# Patient Record
Sex: Female | Born: 1980 | Race: White | Hispanic: No | Smoking: Former smoker
Health system: Southern US, Community
[De-identification: ages and names within clinical notes are randomized; demographics above are authoritative.]

## PROBLEM LIST (undated history)

## (undated) DIAGNOSIS — Z9109 Other allergy status, other than to drugs and biological substances: Secondary | ICD-10-CM

## (undated) HISTORY — PX: BACK SURGERY: SHX140

---

## 2007-01-19 ENCOUNTER — Emergency Department (HOSPITAL_COMMUNITY): Admission: EM | Admit: 2007-01-19 | Discharge: 2007-01-19 | Payer: Self-pay | Admitting: *Deleted

## 2008-07-06 IMAGING — CR DG ANKLE COMPLETE 3+V*R*
3 series · 3 of 3 positions shown · non-contrast
Comparison: none

CLINICAL DATA: Twisting injury.  Ankle pain.
 RIGHT ANKLE ? 3 VIEW:

[t ankle joint ap right]
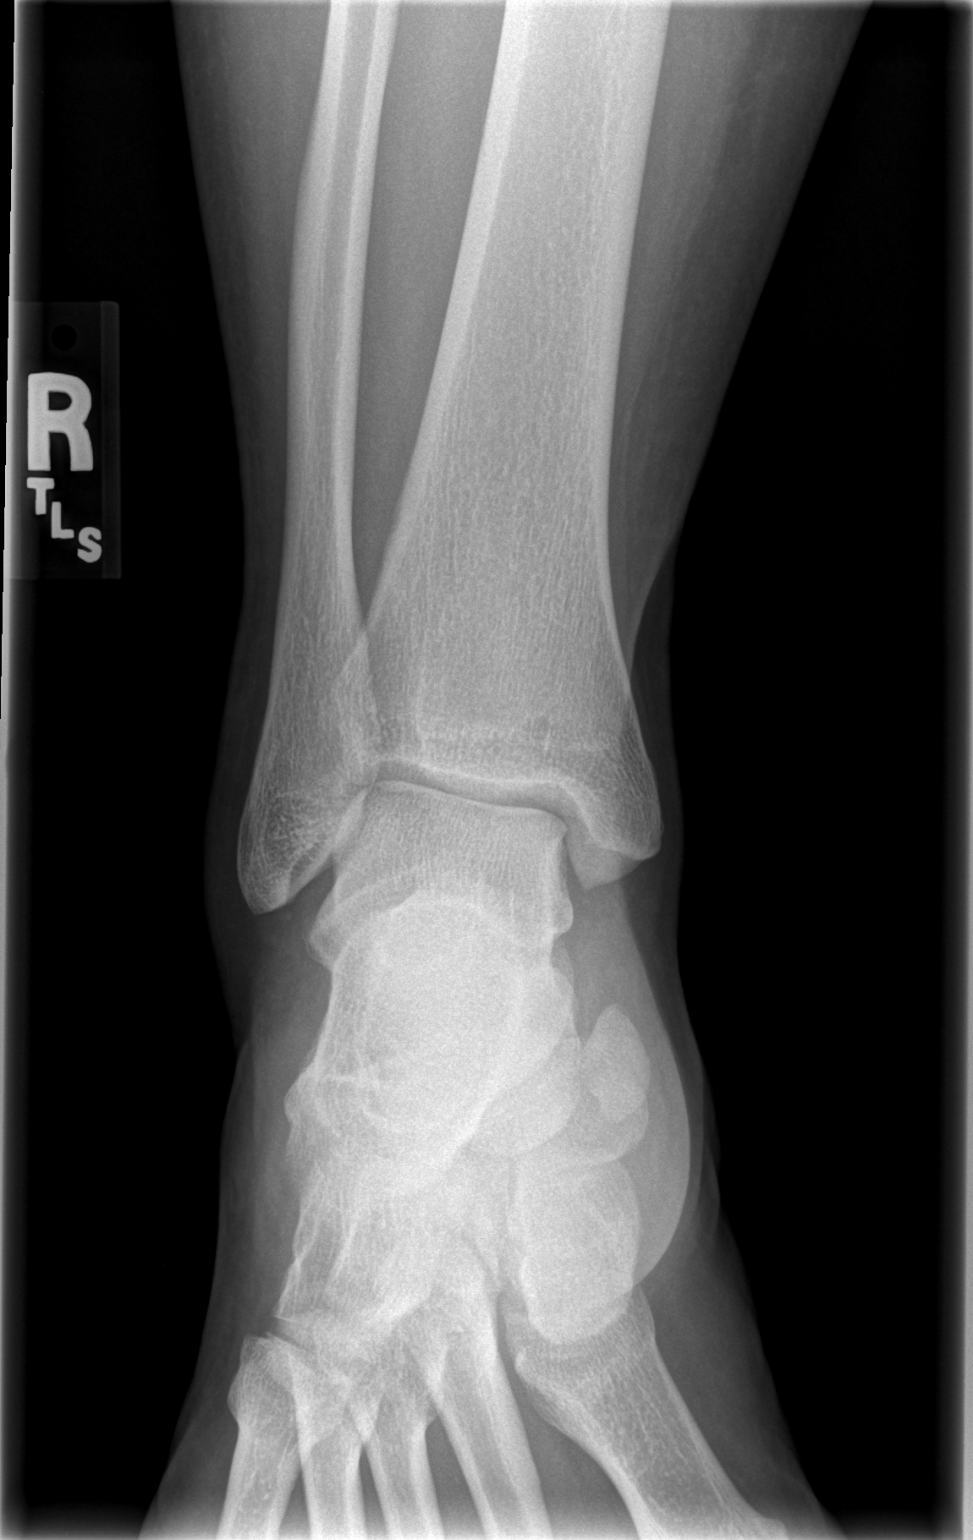

[t ankle joint oblique right]
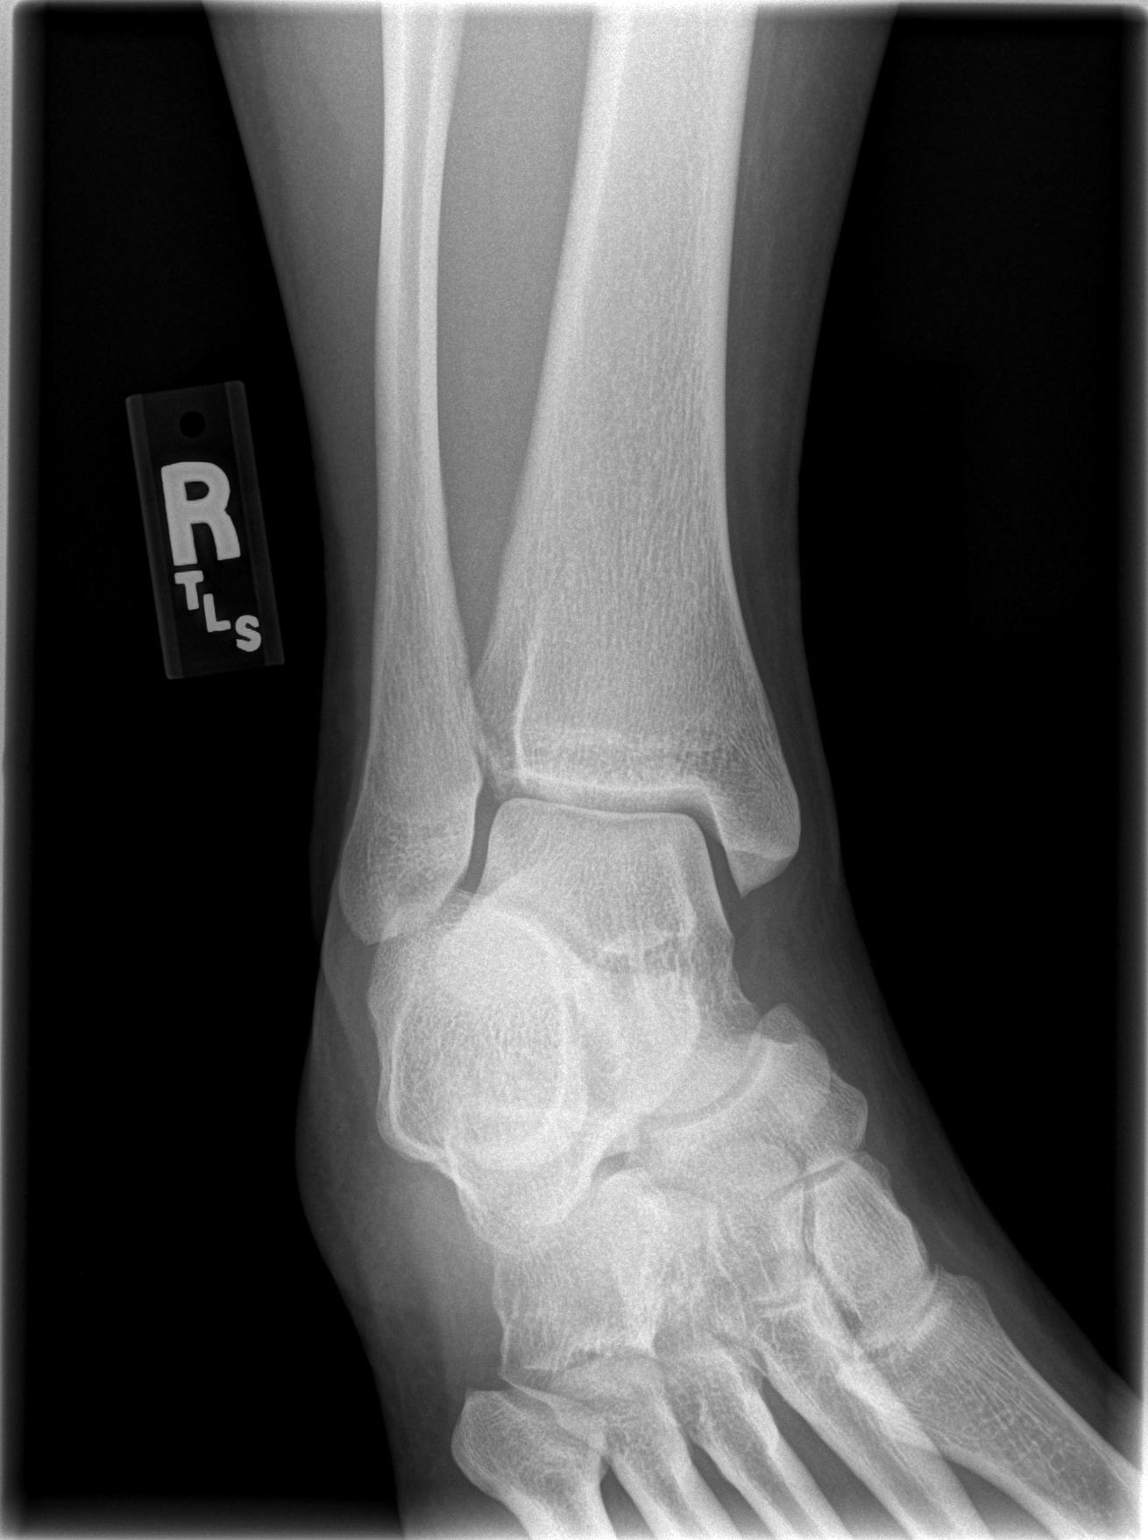

[t ankle joint lat right]
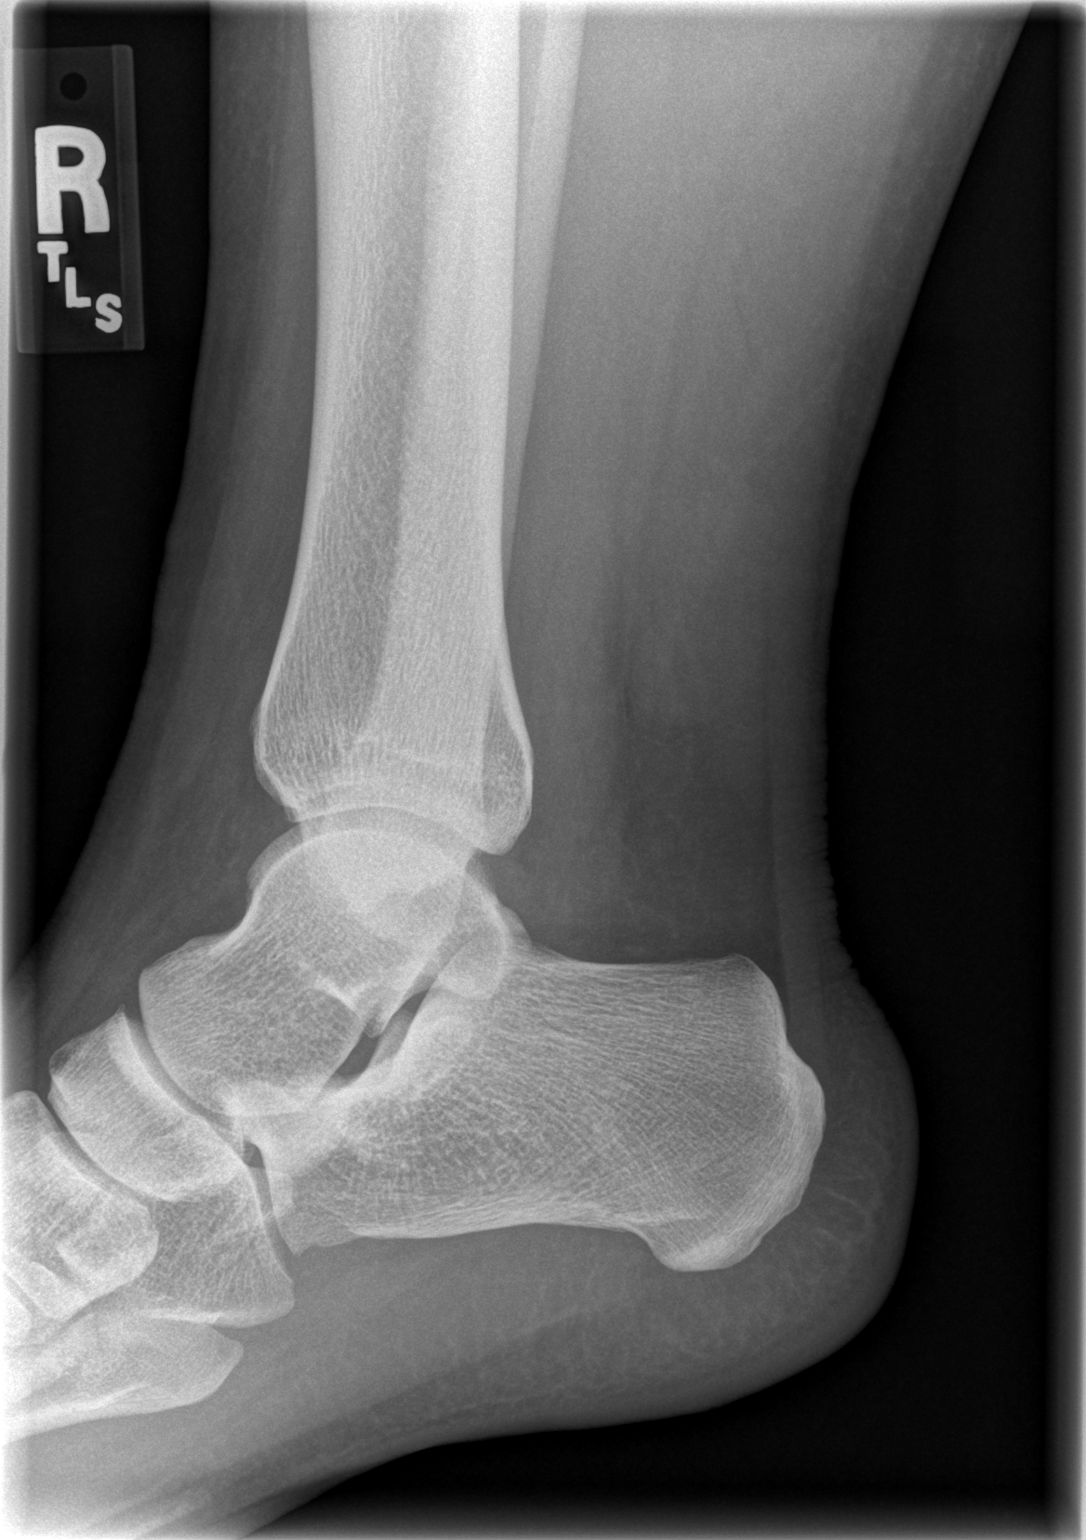

[3 of 3 positions shown; findings below may reference images not displayed]

FINDINGS: Soft tissues normal. There is no joint effusion.  Negative for fracture.
 RIGHT FOOT ? 3 VIEW:
FINDINGS: Alignment of the foot anatomic. Negative for fracture.  Hallux valgus deformity with secondary degenerative changes.  Incidental prominent accessory navicular.
IMPRESSION: Negative for fracture.

## 2010-07-24 ENCOUNTER — Inpatient Hospital Stay (HOSPITAL_COMMUNITY)
Admission: AD | Admit: 2010-07-24 | Discharge: 2010-07-24 | Payer: Self-pay | Source: Home / Self Care | Attending: Obstetrics and Gynecology | Admitting: Obstetrics and Gynecology

## 2010-08-16 ENCOUNTER — Inpatient Hospital Stay (HOSPITAL_COMMUNITY)
Admission: AD | Admit: 2010-08-16 | Discharge: 2010-08-19 | Payer: Self-pay | Source: Home / Self Care | Attending: Obstetrics and Gynecology | Admitting: Obstetrics and Gynecology

## 2010-08-16 LAB — CBC
HCT: 35.7 % — ABNORMAL LOW (ref 36.0–46.0)
Hemoglobin: 12.2 g/dL (ref 12.0–15.0)
MCH: 30.3 pg (ref 26.0–34.0)
MCHC: 34.2 g/dL (ref 30.0–36.0)
MCV: 88.8 fL (ref 78.0–100.0)
Platelets: 176 10*3/uL (ref 150–400)
RBC: 4.02 MIL/uL (ref 3.87–5.11)
RDW: 13.3 % (ref 11.5–15.5)
WBC: 9.5 10*3/uL (ref 4.0–10.5)

## 2010-08-17 LAB — RPR: RPR Ser Ql: NONREACTIVE

## 2010-08-18 LAB — CBC
HCT: 32.9 % — ABNORMAL LOW (ref 36.0–46.0)
Hemoglobin: 11.2 g/dL — ABNORMAL LOW (ref 12.0–15.0)
MCH: 30.4 pg (ref 26.0–34.0)
MCHC: 34 g/dL (ref 30.0–36.0)
MCV: 89.4 fL (ref 78.0–100.0)
Platelets: 155 10*3/uL (ref 150–400)
RBC: 3.68 MIL/uL — ABNORMAL LOW (ref 3.87–5.11)
RDW: 13.7 % (ref 11.5–15.5)
WBC: 12.7 10*3/uL — ABNORMAL HIGH (ref 4.0–10.5)

## 2010-08-24 ENCOUNTER — Ambulatory Visit
Admission: RE | Admit: 2010-08-24 | Discharge: 2010-08-24 | Payer: Self-pay | Source: Home / Self Care | Admitting: Obstetrics and Gynecology

## 2010-08-31 ENCOUNTER — Ambulatory Visit
Admission: RE | Admit: 2010-08-31 | Discharge: 2010-08-31 | Payer: Self-pay | Source: Home / Self Care | Admitting: Obstetrics and Gynecology

## 2011-07-27 ENCOUNTER — Encounter: Payer: Self-pay | Admitting: Emergency Medicine

## 2011-07-27 ENCOUNTER — Emergency Department (INDEPENDENT_AMBULATORY_CARE_PROVIDER_SITE_OTHER)
Admission: EM | Admit: 2011-07-27 | Discharge: 2011-07-27 | Disposition: A | Discharge status: Home / Self Care | Payer: 59 | Source: Home / Self Care | Attending: Emergency Medicine | Admitting: Emergency Medicine

## 2011-07-27 DIAGNOSIS — T7840XA Allergy, unspecified, initial encounter: Secondary | ICD-10-CM

## 2011-07-27 DIAGNOSIS — H1011 Acute atopic conjunctivitis, right eye: Secondary | ICD-10-CM

## 2011-07-27 DIAGNOSIS — H1045 Other chronic allergic conjunctivitis: Secondary | ICD-10-CM

## 2011-07-27 HISTORY — DX: Other allergy status, other than to drugs and biological substances: Z91.09

## 2011-07-27 MED ORDER — PREDNISONE 50 MG PO TABS: ORAL_TABLET | ORAL | Status: AC

## 2011-07-27 MED ORDER — POLYETHYL GLYCOL-PROPYL GLYCOL 0.4-0.3 % OP SOLN: 1.0000 [drp] | Freq: Four times a day (QID) | OPHTHALMIC | Status: DC | PRN

## 2011-07-27 MED ORDER — LORATADINE 10 MG PO TABS: 10.0000 mg | ORAL_TABLET | Freq: Every day | ORAL | Status: AC

## 2011-07-27 MED ORDER — KETOTIFEN FUMARATE 0.025 % OP SOLN: 1.0000 [drp] | Freq: Two times a day (BID) | OPHTHALMIC | Status: AC

## 2011-07-27 NOTE — ED Notes (Signed)
Pt eye swelled up yesterday at work mid-day. Benedryl helped swelling go down. Was fine all evening. No discharge this morning. Today it is itchy, red, and warm, and swelling started again.

## 2011-07-27 NOTE — ED Provider Notes (Signed)
History     CSN: [id number] Arrival date & time: 07/27/2011 12:17 PM   First MD Initiated Contact with Patient 07/27/11 1124      Chief Complaint  Patient presents with  . Conjunctivitis    (Consider location/radiation/quality/duration/timing/severity/associated sxs/prior treatment) HPI Comments: Pt with right periorbital swelling, itching starting yesterday while at work. Took ben adryl and applied ice with relief in itching, swellign. Also with mild right eye conjunctvial injection, "gritty" and "dry" sensation. Wears contacts which she threw away. Pt is highly allergic to most soaps, some lotions, thinks may have accidentally rubbed some across her face. No N/V, fevers, HA, photophobia, eye pain. Pt currently recovering from URI.   Patient is a 30 y.o. female presenting with conjunctivitis. The history is provided by the patient.  Conjunctivitis  The current episode started yesterday. The symptoms are aggravated by nothing. Associated symptoms include eye itching, congestion, rhinorrhea and eye redness. Pertinent negatives include no fever, no decreased vision, no double vision, no photophobia, no ear discharge, no ear pain, no headaches, no hearing loss, no mouth sores, no sore throat, no stridor, no swollen glands and no eye discharge.    Past Medical History  Diagnosis Date  . Environmental allergies     History reviewed. No pertinent past surgical history.  History reviewed. No pertinent family history.  History  Substance Use Topics  . Smoking status: Never Smoker   . Smokeless tobacco: Not on file  . Alcohol Use: Yes    OB History    Grav Para Term Preterm Abortions TAB SAB Ect Mult Living                  Review of Systems  Constitutional: Negative for fever.  HENT: Positive for congestion and rhinorrhea. Negative for hearing loss, ear pain, sore throat, mouth sores and ear discharge.   Eyes: Positive for redness and itching. Negative for double vision,  photophobia and discharge.  Respiratory: Negative for stridor.   Neurological: Negative for headaches.    Allergies  Sodium lauryl sulfate  Home Medications   Current Outpatient Rx  Name Route Sig Dispense Refill  . KETOTIFEN FUMARATE 0.025 % OP SOLN Right Eye Place 1 drop into the right eye 2 (two) times daily. 5 mL 0  . LORATADINE 10 MG PO TABS Oral Take 1 tablet (10 mg total) by mouth daily. 10 tablet 0  . POLYETHYL GLYCOL-PROPYL GLYCOL 0.4-0.3 % OP SOLN Ophthalmic Apply 1 drop to eye 4 (four) times daily as needed. 5 mL 0  . PREDNISONE 50 MG PO TABS  1 tablet po daily x 2 days, then 1/2 tablet once daily for 2 days 5 tablet 0    BP 109/71  Pulse 94  Temp(Src) 97.5 F (36.4 C) (Oral)  Resp 20  SpO2 98%  LMP 07/23/2011  Physical Exam  Nursing note and vitals reviewed. Constitutional: She is oriented to person, place, and time. She appears well-developed and well-nourished. No distress.  HENT:  Head: Normocephalic and atraumatic.  Eyes: EOM and lids are normal. Pupils are equal, round, and reactive to light. No foreign bodies found. Right eye exhibits no chemosis, no discharge, no exudate and no hordeolum. No foreign body present in the right eye. Right conjunctiva is injected. Right conjunctiva has no hemorrhage. No scleral icterus.       Upper eyelid swelling. Nontender. No rash.   Neck: Normal range of motion.  Cardiovascular: Regular rhythm.   Pulmonary/Chest: Effort normal and breath sounds normal.  Abdominal:  She exhibits no distension.  Musculoskeletal: Normal range of motion.  Neurological: She is alert and oriented to person, place, and time.  Skin: Skin is warm and dry.  Psychiatric: She has a normal mood and affect. Her behavior is normal. Judgment and thought content normal.    ED Course  Procedures (including critical care time)  Labs Reviewed - No data to display No results found.   1. Allergic reaction   2. Allergic conjunctivitis of right eye        MDM    Luiz Blare, MD 07/27/11 1339

## 2011-10-29 ENCOUNTER — Encounter: Payer: Self-pay | Admitting: *Deleted

## 2011-10-29 ENCOUNTER — Emergency Department
Admission: EM | Admit: 2011-10-29 | Discharge: 2011-10-29 | Disposition: A | Discharge status: Home / Self Care | Payer: 59 | Source: Home / Self Care | Attending: Family Medicine | Admitting: Family Medicine

## 2011-10-29 ENCOUNTER — Emergency Department: Admit: 2011-10-29 | Discharge: 2011-10-29 | Disposition: A | Discharge status: Home / Self Care | Payer: 59

## 2011-10-29 DIAGNOSIS — S335XXA Sprain of ligaments of lumbar spine, initial encounter: Secondary | ICD-10-CM

## 2011-10-29 MED ORDER — CYCLOBENZAPRINE HCL 10 MG PO TABS: 10.0000 mg | ORAL_TABLET | Freq: Two times a day (BID) | ORAL | Status: AC | PRN

## 2011-10-29 MED ORDER — NAPROXEN 500 MG PO TABS: 500.0000 mg | ORAL_TABLET | Freq: Two times a day (BID) | ORAL | Status: AC

## 2011-10-29 MED ORDER — HYDROCODONE-ACETAMINOPHEN 5-500 MG PO TABS: 1.0000 | ORAL_TABLET | Freq: Every evening | ORAL | Status: AC | PRN

## 2011-10-29 NOTE — ED Notes (Signed)
Pt c/o low back pain x this AM. She has taken IBF with no relief.

## 2011-10-29 NOTE — Discharge Instructions (Signed)
Apply ice pack for 30 to 45 minutes every 1 to 4 hours.  Continue until pain decreases. Limit athletic activities until improved.  Begin exercises as per instruction sheets.

## 2011-10-29 NOTE — ED Provider Notes (Signed)
History     CSN: [id number]  Arrival date & time 10/29/11  7829   First MD Initiated Contact with Patient 10/29/11 1933      Chief Complaint  Patient presents with  . Back Pain      HPI Comments: Patient states that after an uneventful 3 mile run yesterday she did some back stretching exercises that she does not normally do.  This morning she awoke with stiffness and nonradiating pain in her lower back.  The pain is worse when standing, and she feels better leaning forward.  No bowel or bladder dysfunction.  No saddle numbness.  Patient is a 31 y.o. female presenting with back pain. The history is provided by the patient.  Back Pain  Episode onset: this morning. The problem occurs constantly. The problem has been gradually worsening. The pain is associated with no known injury. Quality: dull. The pain does not radiate. The pain is at a severity of 4/10. The pain is moderate. The symptoms are aggravated by bending, twisting and certain positions. The pain is worse during the day. Stiffness is present all day. Pertinent negatives include no chest pain, no fever, no numbness, no weight loss, no headaches, no abdominal pain, no abdominal swelling, no bowel incontinence, no perianal numbness, no bladder incontinence, no dysuria, no pelvic pain, no leg pain, no paresthesias, no paresis, no tingling and no weakness. She has tried NSAIDs for the symptoms. The treatment provided no relief.    Past Medical History  Diagnosis Date  . Environmental allergies     Past Surgical History  Procedure Date  . Back surgery     mass removed from upper back     Family History  Problem Relation Age of Onset  . Hyperlipidemia Mother   . Diabetes Father   . Thrombosis Father     History  Substance Use Topics  . Smoking status: Former Games developer  . Smokeless tobacco: Not on file  . Alcohol Use: No    OB History    Grav Para Term Preterm Abortions TAB SAB Ect Mult Living                  Review  of Systems  Constitutional: Negative for fever and weight loss.  Cardiovascular: Negative for chest pain.  Gastrointestinal: Negative for abdominal pain and bowel incontinence.  Genitourinary: Negative for bladder incontinence, dysuria and pelvic pain.  Musculoskeletal: Positive for back pain.  Neurological: Negative for tingling, weakness, numbness, headaches and paresthesias.  All other systems reviewed and are negative.    Allergies  Parabens and Sodium lauryl sulfate  Home Medications   Current Outpatient Rx  Name Route Sig Dispense Refill  . CYCLOBENZAPRINE HCL 10 MG PO TABS Oral Take 1 tablet (10 mg total) by mouth 2 (two) times daily as needed for muscle spasms. 20 tablet 0  . HYDROCODONE-ACETAMINOPHEN 5-500 MG PO TABS Oral Take 1 tablet by mouth at bedtime as needed for pain. 10 tablet 0  . LORATADINE 10 MG PO TABS Oral Take 1 tablet (10 mg total) by mouth daily. 10 tablet 0  . NAPROXEN 500 MG PO TABS Oral Take 1 tablet (500 mg total) by mouth 2 (two) times daily. (every 12 hours with food) 20 tablet 1  . POLYETHYL GLYCOL-PROPYL GLYCOL 0.4-0.3 % OP SOLN Ophthalmic Apply 1 drop to eye 4 (four) times daily as needed. 5 mL 0    BP 117/76  Pulse 60  Temp(Src) 97.8 F (36.6 C) (Oral)  Resp 16  Ht 5\' 6"  (1.676 m)  Wt 191 lb (86.637 kg)  BMI 30.83 kg/m2  SpO2 100%  LMP 10/15/2011  Physical Exam  Nursing note and vitals reviewed. Constitutional: She is oriented to person, place, and time. She appears well-developed and well-nourished. No distress.  HENT:  Head: Normocephalic.  Eyes: Conjunctivae and EOM are normal. Pupils are equal, round, and reactive to light.  Cardiovascular: Normal rate, regular rhythm and normal heart sounds.   Pulmonary/Chest: Effort normal and breath sounds normal. No respiratory distress.  Abdominal: Soft. Bowel sounds are normal. There is no tenderness.  Musculoskeletal: She exhibits no edema and no tenderness.       Lumbar back: She exhibits  decreased range of motion and tenderness. She exhibits no bony tenderness, no swelling, no edema, no deformity, no laceration and no spasm.       Back:       Tenderness to palpation as noted on diagram.   Patient can heel/toe walk and squat without difficulty. Straight leg raising test is negative.  Sitting knee extension test is negative.  Strength and sensation in the lower extremities is normal.  Patellar and achilles reflexes are normal   Lymphadenopathy:    She has no cervical adenopathy.  Neurological: She is alert and oriented to person, place, and time. She has normal reflexes.  Skin: Skin is warm and dry. No rash noted.    ED Course  Procedures  none  Labs Reviewed - No data to display Dg Lumbar Spine Complete  10/29/2011  *RADIOLOGY REPORT*  Clinical Data: Low back pain and stiffness.  LUMBAR SPINE - COMPLETE 4+ VIEW  Comparison: None.  Findings: There are five lumbar-type non-rib bearing vertebra.  Prominence of stool throughout the colon suggests constipation.  Lumbar vertebral alignment appears normal.  No lumbar spine fracture or acute subluxation is identified.  No acute lumbar spine findings noted.  IMPRESSION:  1.  No significant lumbar spine abnormality is observed. 2. Prominence of stool throughout the colon suggests constipation.  Original Report Authenticated By: Dellia Cloud, M.D.     1. Low back strain       MDM   Begin Naproxen.  Lortab at bedtime. Flexeril prn Apply ice pack for 30 to 45 minutes every 1 to 4 hours.  Continue until pain decreases. Limit athletic activities until improved.  Begin exercises as per instruction sheets ([facility] information and instruction handout given)  Followup with [facility] if not improving about two weeks.         Lattie Haw, MD 10/30/11 [phone]

## 2019-07-20 ENCOUNTER — Other Ambulatory Visit: Payer: Self-pay

## 2019-07-20 DIAGNOSIS — Z20822 Contact with and (suspected) exposure to covid-19: Secondary | ICD-10-CM

## 2019-07-21 LAB — NOVEL CORONAVIRUS, NAA: SARS-CoV-2, NAA: NOT DETECTED

## 2021-08-13 NOTE — L&D Delivery Note (Signed)
Delivery Note At 9:41 AM a viable and healthy female was delivered via Vaginal, Spontaneous (Presentation: Left Occiput Anterior).  APGAR: 9, 9; weight pending .   Placenta status: Spontaneous, Intact.  Cord: 3 vessels   The patient pushed for approximately 55 minutes and delivered a vigorous female infant in the left occiput anterior presentation with Apgar scores of 8 at 1 minute and 9 at 5 minutes.  The infant was placed on the maternal abdomen and following a 1 minute delay, the cord was clamped and cut.  The placenta delivered spontaneously, intact, with three-vessel cord.  A small first-degree perineal laceration was repaired with 3-0 Vicryl.  All sponge, instrument, needle counts were correct.  Mother and baby are doing well following delivery.  EBL 587 cc.   Anesthesia: Epidural Episiotomy: None Lacerations: 1st degree vaginal  Suture Repair: 3.0 vicryl Est. Blood Loss (mL):  587 cc  Mom to postpartum.  Baby to Couplet care / Skin to Skin.  Madison Mitchell 04/10/2022, 10:07 AM

## 2021-10-09 LAB — HEPATITIS C ANTIBODY
HCV Ab: NEGATIVE
HCV Ab: NEGATIVE

## 2021-10-09 LAB — OB RESULTS CONSOLE GC/CHLAMYDIA
Chlamydia: NEGATIVE
Chlamydia: NEGATIVE
Neisseria Gonorrhea: NEGATIVE
Neisseria Gonorrhea: NEGATIVE

## 2021-10-09 LAB — OB RESULTS CONSOLE RPR
RPR: NONREACTIVE
RPR: NONREACTIVE

## 2021-10-09 LAB — OB RESULTS CONSOLE ABO/RH: RH Type: POSITIVE

## 2021-10-09 LAB — OB RESULTS CONSOLE HEPATITIS B SURFACE ANTIGEN
Hepatitis B Surface Ag: NEGATIVE
Hepatitis B Surface Ag: NEGATIVE

## 2021-10-09 LAB — OB RESULTS CONSOLE ANTIBODY SCREEN: Antibody Screen: NEGATIVE

## 2021-10-09 LAB — OB RESULTS CONSOLE VARICELLA ZOSTER ANTIBODY, IGG: Varicella: IMMUNE

## 2021-10-09 LAB — OB RESULTS CONSOLE HIV ANTIBODY (ROUTINE TESTING)
HIV: NONREACTIVE
HIV: NONREACTIVE

## 2021-10-09 LAB — OB RESULTS CONSOLE RUBELLA ANTIBODY, IGM
Rubella: NON-IMMUNE/NOT IMMUNE
Rubella: NON-IMMUNE/NOT IMMUNE

## 2022-03-23 LAB — OB RESULTS CONSOLE GBS: GBS: NEGATIVE

## 2022-04-04 ENCOUNTER — Telehealth (HOSPITAL_COMMUNITY): Payer: Self-pay | Admitting: *Deleted

## 2022-04-04 ENCOUNTER — Encounter (HOSPITAL_COMMUNITY): Payer: Self-pay | Admitting: *Deleted

## 2022-04-04 NOTE — Telephone Encounter (Signed)
Preadmission screen  

## 2022-04-08 ENCOUNTER — Other Ambulatory Visit: Payer: Self-pay | Admitting: Obstetrics and Gynecology

## 2022-04-08 DIAGNOSIS — Z3490 Encounter for supervision of normal pregnancy, unspecified, unspecified trimester: Secondary | ICD-10-CM

## 2022-04-08 NOTE — H&P (Signed)
Madison Mitchell is a 41 y.o. female G3P1011 at 72 0/7 wees (EDD 04/16/22 by LMP c/w 8 week [place]) presenting for elective IOL.  Prenatal care significant for advanced maternal age with a low risk NIPT and obesity.  She is rubella nonimmune.  She has been on baby ASA.  She has a h/o abnormal paps and reported that in 2022 it was normal but once result retrieved it was ASCUS-H/+HPV/AGUS --will need repeat pap postpartum.  OB History     Gravida  3   Para  1   Term      Preterm      AB  1   Living  1      SAB  1   IAB      Ectopic      Multiple      Live Births  1         08-17-2010, 39 wks M, 8lbs 5oz, Vaginal Delivery 06-01-2015, 9 wks  SAB  Past Medical History:  Diagnosis Date   Environmental allergies    Past Surgical History:  Procedure Laterality Date   BACK SURGERY     mass removed from upper back    Family History: family history includes Diabetes in her father; Hyperlipidemia in her mother; Thrombosis in her father. Social History:  reports that she has quit smoking. She does not have any smokeless tobacco history on file. She reports that she does not drink alcohol and does not use drugs.     Maternal Diabetes: No Genetic Screening: Normal Maternal Ultrasounds/Referrals: Normal Fetal Ultrasounds or other Referrals:  None Maternal Substance Abuse:  No Significant Maternal Medications:  None Significant Maternal Lab Results:  Group B Strep negative Number of Prenatal Visits:greater than 3 verified prenatal visits Other Comments:  None  Review of Systems  Gastrointestinal:  Negative for abdominal pain.  Genitourinary:  Negative for vaginal bleeding.   Maternal Medical History:  Contractions: Frequency: irregular.   Perceived severity is mild.   Fetal activity: Perceived fetal activity is normal.   Prenatal complications: AMA, obesity Prenatal Complications - Diabetes: none.     There were no vitals taken for this visit. Maternal Exam:  Uterine  Assessment: Contraction strength is mild.  Contraction frequency is irregular.  Abdomen: Patient reports no abdominal tenderness. Fetal presentation: vertex Introitus: Normal vulva. Normal vagina.    Physical Exam Constitutional:      Appearance: Normal appearance.  Cardiovascular:     Rate and Rhythm: Normal rate and regular rhythm.  Pulmonary:     Effort: Pulmonary effort is normal.  Abdominal:     Palpations: Abdomen is soft.  Genitourinary:    General: Normal vulva.  Neurological:     General: No focal deficit present.     Mental Status: She is alert.  Psychiatric:        Mood and Affect: Mood normal.     Prenatal labs: ABO, Rh: AB/Positive/-- (02/27 0000) Antibody: Negative (02/27 0000) Rubella: Nonimmune (02/27 0000) RPR: Nonreactive (02/27 0000)  HBsAg: Negative (02/27 0000)  HIV: Non-reactive (02/27 0000)  GBS:   Negative One hour GCT 142 Three hour GTT WNL NIPT low risk Hgb AA   Assessment/Plan: Pt for elective IOL at term.  Plan pitocin and AROM.  Epidural when desires.   Madison Mitchell 04/08/2022, 8:10 AM

## 2022-04-08 NOTE — H&P (View-Only) (Signed)
Madison Mitchell is a 40 y.o. female G3P1011 at 39 0/7 wees (EDD 04/16/22 by LMP c/w 8 week [place]) presenting for elective IOL.  Prenatal care significant for advanced maternal age with a low risk NIPT and obesity.  She is rubella nonimmune.  She has been on baby ASA.  She has a h/o abnormal paps and reported that in 2022 it was normal but once result retrieved it was ASCUS-H/+HPV/AGUS --will need repeat pap postpartum.  OB History     Gravida  3   Para  1   Term      Preterm      AB  1   Living  1      SAB  1   IAB      Ectopic      Multiple      Live Births  1         08-17-2010, 39 wks M, 8lbs 5oz, Vaginal Delivery 06-01-2015, 9 wks  SAB  Past Medical History:  Diagnosis Date   Environmental allergies    Past Surgical History:  Procedure Laterality Date   BACK SURGERY     mass removed from upper back    Family History: family history includes Diabetes in her father; Hyperlipidemia in her mother; Thrombosis in her father. Social History:  reports that she has quit smoking. She does not have any smokeless tobacco history on file. She reports that she does not drink alcohol and does not use drugs.     Maternal Diabetes: No Genetic Screening: Normal Maternal Ultrasounds/Referrals: Normal Fetal Ultrasounds or other Referrals:  None Maternal Substance Abuse:  No Significant Maternal Medications:  None Significant Maternal Lab Results:  Group B Strep negative Number of Prenatal Visits:greater than 3 verified prenatal visits Other Comments:  None  Review of Systems  Gastrointestinal:  Negative for abdominal pain.  Genitourinary:  Negative for vaginal bleeding.   Maternal Medical History:  Contractions: Frequency: irregular.   Perceived severity is mild.   Fetal activity: Perceived fetal activity is normal.   Prenatal complications: AMA, obesity Prenatal Complications - Diabetes: none.     There were no vitals taken for this visit. Maternal Exam:  Uterine  Assessment: Contraction strength is mild.  Contraction frequency is irregular.  Abdomen: Patient reports no abdominal tenderness. Fetal presentation: vertex Introitus: Normal vulva. Normal vagina.    Physical Exam Constitutional:      Appearance: Normal appearance.  Cardiovascular:     Rate and Rhythm: Normal rate and regular rhythm.  Pulmonary:     Effort: Pulmonary effort is normal.  Abdominal:     Palpations: Abdomen is soft.  Genitourinary:    General: Normal vulva.  Neurological:     General: No focal deficit present.     Mental Status: She is alert.  Psychiatric:        Mood and Affect: Mood normal.     Prenatal labs: ABO, Rh: AB/Positive/-- (02/27 0000) Antibody: Negative (02/27 0000) Rubella: Nonimmune (02/27 0000) RPR: Nonreactive (02/27 0000)  HBsAg: Negative (02/27 0000)  HIV: Non-reactive (02/27 0000)  GBS:   Negative One hour GCT 142 Three hour GTT WNL NIPT low risk Hgb AA   Assessment/Plan: Pt for elective IOL at term.  Plan pitocin and AROM.  Epidural when desires.   Madison Mitchell W Domanic Matusek 04/08/2022, 8:10 AM     

## 2022-04-09 ENCOUNTER — Other Ambulatory Visit: Payer: Self-pay

## 2022-04-09 ENCOUNTER — Encounter (HOSPITAL_COMMUNITY): Payer: Self-pay | Admitting: Obstetrics and Gynecology

## 2022-04-09 ENCOUNTER — Inpatient Hospital Stay (HOSPITAL_COMMUNITY)
Admission: AD | Admit: 2022-04-09 | Discharge: 2022-04-11 | DRG: 807 | Disposition: A | Discharge status: Home / Self Care | Payer: Managed Care, Other (non HMO) | Attending: Obstetrics and Gynecology | Admitting: Obstetrics and Gynecology

## 2022-04-09 ENCOUNTER — Inpatient Hospital Stay (HOSPITAL_COMMUNITY): Payer: Managed Care, Other (non HMO)

## 2022-04-09 DIAGNOSIS — O26893 Other specified pregnancy related conditions, third trimester: Secondary | ICD-10-CM | POA: Diagnosis present

## 2022-04-09 DIAGNOSIS — Z87891 Personal history of nicotine dependence: Secondary | ICD-10-CM | POA: Diagnosis not present

## 2022-04-09 DIAGNOSIS — Z3490 Encounter for supervision of normal pregnancy, unspecified, unspecified trimester: Secondary | ICD-10-CM

## 2022-04-09 DIAGNOSIS — Z3A39 39 weeks gestation of pregnancy: Secondary | ICD-10-CM

## 2022-04-09 DIAGNOSIS — O09523 Supervision of elderly multigravida, third trimester: Principal | ICD-10-CM | POA: Diagnosis present

## 2022-04-09 DIAGNOSIS — O99214 Obesity complicating childbirth: Principal | ICD-10-CM | POA: Diagnosis present

## 2022-04-09 LAB — TYPE AND SCREEN
ABO/RH(D): AB POS
Antibody Screen: NEGATIVE

## 2022-04-09 LAB — CBC
HCT: 33.6 % — ABNORMAL LOW (ref 36.0–46.0)
Hemoglobin: 11.7 g/dL — ABNORMAL LOW (ref 12.0–15.0)
MCH: 31.3 pg (ref 26.0–34.0)
MCHC: 34.8 g/dL (ref 30.0–36.0)
MCV: 89.8 fL (ref 80.0–100.0)
Platelets: 207 10*3/uL (ref 150–400)
RBC: 3.74 MIL/uL — ABNORMAL LOW (ref 3.87–5.11)
RDW: 13.2 % (ref 11.5–15.5)
WBC: 11.4 10*3/uL — ABNORMAL HIGH (ref 4.0–10.5)
nRBC: 0 % (ref 0.0–0.2)

## 2022-04-09 MED ORDER — TERBUTALINE SULFATE 1 MG/ML IJ SOLN: 0.2500 mg | Freq: Once | INTRAMUSCULAR | Status: DC | PRN

## 2022-04-09 MED ORDER — EPHEDRINE 5 MG/ML INJ
10.0000 mg | INTRAVENOUS | Status: DC | PRN
Start: 2022-04-09 — End: 2022-04-10

## 2022-04-09 MED ORDER — SOD CITRATE-CITRIC ACID 500-334 MG/5ML PO SOLN: 30.0000 mL | ORAL | Status: DC | PRN

## 2022-04-09 MED ORDER — OXYTOCIN BOLUS FROM INFUSION
333.0000 mL | Freq: Once | INTRAVENOUS | Status: AC
  Administered 2022-04-10: 333 mL via INTRAVENOUS

## 2022-04-09 MED ORDER — LACTATED RINGERS IV SOLN: 500.0000 mL | Freq: Once | INTRAVENOUS | Status: DC

## 2022-04-09 MED ORDER — OXYTOCIN-SODIUM CHLORIDE 30-0.9 UT/500ML-% IV SOLN: 2.5000 [IU]/h | INTRAVENOUS | Status: DC

## 2022-04-09 MED ORDER — ONDANSETRON HCL 4 MG/2ML IJ SOLN
4.0000 mg | Freq: Four times a day (QID) | INTRAMUSCULAR | Status: DC | PRN
  Administered 2022-04-10: 4 mg via INTRAVENOUS
  Filled 2022-04-09: qty 2

## 2022-04-09 MED ORDER — OXYTOCIN-SODIUM CHLORIDE 30-0.9 UT/500ML-% IV SOLN
1.0000 m[IU]/min | INTRAVENOUS | Status: DC
  Administered 2022-04-09: 2 m[IU]/min via INTRAVENOUS
  Filled 2022-04-09: qty 500

## 2022-04-09 MED ORDER — OXYCODONE-ACETAMINOPHEN 5-325 MG PO TABS: 2.0000 | ORAL_TABLET | ORAL | Status: DC | PRN

## 2022-04-09 MED ORDER — DIPHENHYDRAMINE HCL 50 MG/ML IJ SOLN: 12.5000 mg | INTRAMUSCULAR | Status: DC | PRN

## 2022-04-09 MED ORDER — LACTATED RINGERS IV SOLN: 500.0000 mL | INTRAVENOUS | Status: DC | PRN

## 2022-04-09 MED ORDER — PHENYLEPHRINE 80 MCG/ML (10ML) SYRINGE FOR IV PUSH (FOR BLOOD PRESSURE SUPPORT)
80.0000 ug | PREFILLED_SYRINGE | INTRAVENOUS | Status: DC | PRN
Start: 2022-04-09 — End: 2022-04-10

## 2022-04-09 MED ORDER — LIDOCAINE HCL (PF) 1 % IJ SOLN: 30.0000 mL | INTRAMUSCULAR | Status: DC | PRN

## 2022-04-09 MED ORDER — EPHEDRINE 5 MG/ML INJ: 10.0000 mg | INTRAVENOUS | Status: DC | PRN

## 2022-04-09 MED ORDER — PHENYLEPHRINE 80 MCG/ML (10ML) SYRINGE FOR IV PUSH (FOR BLOOD PRESSURE SUPPORT)
80.0000 ug | PREFILLED_SYRINGE | INTRAVENOUS | Status: DC | PRN
  Filled 2022-04-09: qty 10

## 2022-04-09 MED ORDER — LACTATED RINGERS IV SOLN: INTRAVENOUS | Status: DC

## 2022-04-09 MED ORDER — OXYCODONE-ACETAMINOPHEN 5-325 MG PO TABS: 1.0000 | ORAL_TABLET | ORAL | Status: DC | PRN

## 2022-04-09 MED ORDER — FENTANYL-BUPIVACAINE-NACL 0.5-0.125-0.9 MG/250ML-% EP SOLN
12.0000 mL/h | EPIDURAL | Status: DC | PRN
  Administered 2022-04-10: 12 mL/h via EPIDURAL
  Filled 2022-04-09: qty 250

## 2022-04-09 MED ORDER — FENTANYL CITRATE (PF) 100 MCG/2ML IJ SOLN
50.0000 ug | INTRAMUSCULAR | Status: DC | PRN
  Administered 2022-04-09 – 2022-04-10 (×2): 50 ug via INTRAVENOUS
  Filled 2022-04-09 (×2): qty 2

## 2022-04-09 NOTE — Interval H&P Note (Signed)
History and Physical Interval Note:  04/09/2022 9:58 PM  Pt finally getting admitted for elective IOL after holding most of day when L&D was busy.   Afeb VSS FHR category 1 Cervix 50/2/-2 AROM scant fluid, clear Pitocin started per protocol; epidural prn   Madison Mitchell

## 2022-04-10 ENCOUNTER — Encounter (HOSPITAL_COMMUNITY): Payer: Self-pay | Admitting: Obstetrics and Gynecology

## 2022-04-10 ENCOUNTER — Inpatient Hospital Stay (HOSPITAL_COMMUNITY): Payer: Managed Care, Other (non HMO) | Admitting: Anesthesiology

## 2022-04-10 LAB — RPR: RPR Ser Ql: NONREACTIVE

## 2022-04-10 MED ORDER — ZOLPIDEM TARTRATE 5 MG PO TABS: 5.0000 mg | ORAL_TABLET | Freq: Every evening | ORAL | Status: DC | PRN

## 2022-04-10 MED ORDER — SENNOSIDES-DOCUSATE SODIUM 8.6-50 MG PO TABS: 2.0000 | ORAL_TABLET | Freq: Every day | ORAL | Status: DC

## 2022-04-10 MED ORDER — ONDANSETRON HCL 4 MG/2ML IJ SOLN: 4.0000 mg | INTRAMUSCULAR | Status: DC | PRN

## 2022-04-10 MED ORDER — METHYLERGONOVINE MALEATE 0.2 MG PO TABS: 0.2000 mg | ORAL_TABLET | ORAL | Status: DC | PRN

## 2022-04-10 MED ORDER — METHYLERGONOVINE MALEATE 0.2 MG/ML IJ SOLN: 0.2000 mg | INTRAMUSCULAR | Status: DC | PRN

## 2022-04-10 MED ORDER — DIBUCAINE (PERIANAL) 1 % EX OINT: 1.0000 | TOPICAL_OINTMENT | CUTANEOUS | Status: DC | PRN

## 2022-04-10 MED ORDER — ONDANSETRON HCL 4 MG PO TABS: 4.0000 mg | ORAL_TABLET | ORAL | Status: DC | PRN

## 2022-04-10 MED ORDER — IBUPROFEN 600 MG PO TABS
600.0000 mg | ORAL_TABLET | Freq: Four times a day (QID) | ORAL | Status: DC
  Administered 2022-04-11 (×2): 600 mg via ORAL
  Filled 2022-04-10 (×2): qty 1

## 2022-04-10 MED ORDER — OXYCODONE HCL 5 MG PO TABS: 5.0000 mg | ORAL_TABLET | ORAL | Status: DC | PRN

## 2022-04-10 MED ORDER — ACETAMINOPHEN 325 MG PO TABS: 650.0000 mg | ORAL_TABLET | ORAL | Status: DC | PRN

## 2022-04-10 MED ORDER — TETANUS-DIPHTH-ACELL PERTUSSIS 5-2.5-18.5 LF-MCG/0.5 IM SUSY: 0.5000 mL | PREFILLED_SYRINGE | Freq: Once | INTRAMUSCULAR | Status: DC

## 2022-04-10 MED ORDER — DIPHENHYDRAMINE HCL 25 MG PO CAPS: 25.0000 mg | ORAL_CAPSULE | Freq: Four times a day (QID) | ORAL | Status: DC | PRN

## 2022-04-10 MED ORDER — COCONUT OIL OIL: 1.0000 | TOPICAL_OIL | Status: DC | PRN

## 2022-04-10 MED ORDER — WITCH HAZEL-GLYCERIN EX PADS
1.0000 | MEDICATED_PAD | CUTANEOUS | Status: DC | PRN
Start: 2022-04-10 — End: 2022-04-11

## 2022-04-10 MED ORDER — BENZOCAINE-MENTHOL 20-0.5 % EX AERO
1.0000 | INHALATION_SPRAY | CUTANEOUS | Status: DC | PRN
  Filled 2022-04-10: qty 56

## 2022-04-10 MED ORDER — LIDOCAINE-EPINEPHRINE (PF) 2 %-1:200000 IJ SOLN
INTRAMUSCULAR | Status: DC | PRN
  Administered 2022-04-10: 5 mL via EPIDURAL

## 2022-04-10 MED ORDER — SIMETHICONE 80 MG PO CHEW: 80.0000 mg | CHEWABLE_TABLET | ORAL | Status: DC | PRN

## 2022-04-10 MED ORDER — PRENATAL MULTIVITAMIN CH: 1.0000 | ORAL_TABLET | Freq: Every day | ORAL | Status: DC

## 2022-04-10 MED ORDER — OXYCODONE HCL 5 MG PO TABS: 10.0000 mg | ORAL_TABLET | ORAL | Status: DC | PRN

## 2022-04-10 NOTE — Progress Notes (Addendum)
Patient is rubella non-immune. Discussed MMR vaccine. Patient declines. Patient also plans to strictly pump and bottle feed. Offered to set up hospital DEBP. Patient states she has her own pump and declines hospital pump.  Maxwell Caul, Leretha Dykes [place]

## 2022-04-10 NOTE — Progress Notes (Signed)
Patient ID: Madison Mitchell, female   DOB: 01/07/1981, 41 y.o.   MRN: [id number] Pt feeling some mild intermittent pressure  Afeb VSS Cervix 80/6/-1  FHR with good variability, mild variable decels with contractions  Progressing, continue to follow

## 2022-04-10 NOTE — Anesthesia Procedure Notes (Signed)
Epidural Patient location during procedure: OB Start time: 04/10/2022 1:05 AM End time: 04/10/2022 1:15 AM  Staffing Anesthesiologist: Elmer Picker, MD Performed: anesthesiologist   Preanesthetic Checklist Completed: patient identified, IV checked, risks and benefits discussed, monitors and equipment checked, pre-op evaluation and timeout performed  Epidural Patient position: sitting Prep: DuraPrep and site prepped and draped Patient monitoring: continuous pulse ox, blood pressure, heart rate and cardiac monitor Approach: midline Location: L3-L4 Injection technique: LOR air  Needle:  Needle type: Tuohy  Needle gauge: 17 G Needle length: 9 cm Needle insertion depth: 6 cm Catheter type: closed end flexible Catheter size: 19 Gauge Catheter at skin depth: 11 cm Test dose: negative  Assessment Sensory level: T8 Events: blood not aspirated, injection not painful, no injection resistance, no paresthesia and negative IV test  Additional Notes Patient identified. Risks/Benefits/Options discussed with patient including but not limited to bleeding, infection, nerve damage, paralysis, failed block, incomplete pain control, headache, blood pressure changes, nausea, vomiting, reactions to medication both or allergic, itching and postpartum back pain. Confirmed with bedside nurse the patient's most recent platelet count. Confirmed with patient that they are not currently taking any anticoagulation, have any bleeding history or any family history of bleeding disorders. Patient expressed understanding and wished to proceed. All questions were answered. Sterile technique was used throughout the entire procedure. Please see nursing notes for vital signs. Test dose was given through epidural catheter and negative prior to continuing to dose epidural or start infusion. Warning signs of high block given to the patient including shortness of breath, tingling/numbness in hands, complete motor block,  or any concerning symptoms with instructions to call for help. Patient was given instructions on fall risk and not to get out of bed. All questions and concerns addressed with instructions to call with any issues or inadequate analgesia.  Reason for block:procedure for pain

## 2022-04-10 NOTE — Anesthesia Preprocedure Evaluation (Signed)
Anesthesia Evaluation  Patient identified by MRN, date of birth, ID band Patient awake    Reviewed: Allergy & Precautions, NPO status , Patient's Chart, lab work & pertinent test results  Airway Mallampati: III  TM Distance: >3 FB Neck ROM: Full    Dental no notable dental hx.    Pulmonary neg pulmonary ROS, former smoker,    Pulmonary exam normal breath sounds clear to auscultation       Cardiovascular negative cardio ROS Normal cardiovascular exam Rhythm:Regular Rate:Normal     Neuro/Psych negative neurological ROS  negative psych ROS   GI/Hepatic negative GI ROS, Neg liver ROS,   Endo/Other  Morbid obesity (BMI 46)  Renal/GU negative Renal ROS  negative genitourinary   Musculoskeletal negative musculoskeletal ROS (+)   Abdominal   Peds  Hematology negative hematology ROS (+)   Anesthesia Other Findings Elective IOL  Reproductive/Obstetrics (+) Pregnancy                             Anesthesia Physical Anesthesia Plan  ASA: 3  Anesthesia Plan: Epidural   Post-op Pain Management:    Induction:   PONV Risk Score and Plan: Treatment may vary due to age or medical condition  Airway Management Planned: Natural Airway  Additional Equipment:   Intra-op Plan:   Post-operative Plan:   Informed Consent: I have reviewed the patients History and Physical, chart, labs and discussed the procedure including the risks, benefits and alternatives for the proposed anesthesia with the patient or authorized representative who has indicated his/her understanding and acceptance.       Plan Discussed with: Anesthesiologist  Anesthesia Plan Comments: (Patient identified. Risks, benefits, options discussed with patient including but not limited to bleeding, infection, nerve damage, paralysis, failed block, incomplete pain control, headache, blood pressure changes, nausea, vomiting, reactions to  medication, itching, and post partum back pain. Confirmed with bedside nurse the patient's most recent platelet count. Confirmed with the patient that they are not taking any anticoagulation, have any bleeding history or any family history of bleeding disorders. Patient expressed understanding and wishes to proceed. All questions were answered. )        Anesthesia Quick Evaluation

## 2022-04-10 NOTE — Anesthesia Postprocedure Evaluation (Signed)
Anesthesia Post Note  Patient: Suesan Mohrmann  Procedure(s) Performed: AN AD HOC LABOR EPIDURAL     Patient location during evaluation: Mother Baby Anesthesia Type: Epidural Level of consciousness: awake and alert Pain management: pain level controlled Vital Signs Assessment: post-procedure vital signs reviewed and stable Respiratory status: spontaneous breathing, nonlabored ventilation and respiratory function stable Cardiovascular status: stable Postop Assessment: no headache, no backache and epidural receding Anesthetic complications: no   No notable events documented.  Last Vitals:  Vitals:   04/10/22 1235 04/10/22 1635  BP: 103/60 113/64  Pulse: (!) 104 91  Resp: 16 18  Temp: 36.6 C 36.8 C  SpO2: 95%     Last Pain:  Vitals:   04/10/22 1635  TempSrc: Oral  PainSc: 1    Pain Goal: Patients Stated Pain Goal: 2 (04/10/22 1235)                 Aseret Hoffman

## 2022-04-10 NOTE — Progress Notes (Signed)
Patient ID: Madison Mitchell, female   DOB: 01-Sep-1980, 41 y.o.   MRN: [id number] Pt comfortable with epidural  Afeb VSS FHR category 1 overall with occasional variable decelerations , contractions not tracing  Cervix 80/3-4/-1  IUPC placed with FSE to monitor contractions and FHR more accurately with maternal habitus  Pitocin @14  mu

## 2022-04-10 NOTE — Lactation Note (Signed)
This note was copied from a baby's chart. Lactation Consultation Note  Patient Name: Madison Mitchell [id number] Date: 04/10/2022 Reason for consult: Initial assessment;Term Age:40 hours   LC Note:  Visited with family; birth parent desires to pump and bottle feed only.  She is giving Enfamil until her milk comes to volume.  She is using her own personal Medela pump and has no interest in using the hospital grade electric pump.  Informed her that she may call at any time if she has any questions/concerns.  Support person present.   Maternal Data    Feeding Mother's Current Feeding Choice: Breast Milk and Formula (Formula as supplementation until milk comes to volume) Nipple Type: Slow - flow  LATCH Score                    Lactation Tools Discussed/Used    Interventions    Discharge Pump: Personal (Medela)  Consult Status Consult Status: Complete (mother declined follow up)    Janiyah Beery R Kingston Guiles 04/10/2022, 1:56 PM

## 2022-04-11 LAB — CBC
HCT: 31.5 % — ABNORMAL LOW (ref 36.0–46.0)
Hemoglobin: 10.8 g/dL — ABNORMAL LOW (ref 12.0–15.0)
MCH: 31 pg (ref 26.0–34.0)
MCHC: 34.3 g/dL (ref 30.0–36.0)
MCV: 90.5 fL (ref 80.0–100.0)
Platelets: 177 10*3/uL (ref 150–400)
RBC: 3.48 MIL/uL — ABNORMAL LOW (ref 3.87–5.11)
RDW: 13.3 % (ref 11.5–15.5)
WBC: 14.5 10*3/uL — ABNORMAL HIGH (ref 4.0–10.5)
nRBC: 0 % (ref 0.0–0.2)

## 2022-04-11 MED ORDER — IBUPROFEN 600 MG PO TABS: 600.0000 mg | ORAL_TABLET | Freq: Four times a day (QID) | ORAL | 0 refills | Status: AC

## 2022-04-11 NOTE — Discharge Instructions (Signed)
As per discharge pamphlet °

## 2022-04-11 NOTE — Progress Notes (Signed)
PPD #1 No problems, wants to go home today Afeb, VSS Fundus firm, NT at U-1 Continue routine postpartum care, d/c home if baby ok to go  

## 2022-04-11 NOTE — Progress Notes (Signed)
Patient rubella non immune. Discussed MMR vaccine with patient. Patient declines vaccine. Madison Mitchell 

## 2022-04-11 NOTE — Discharge Summary (Signed)
Postpartum Discharge Summary      Patient Name: Madison Mitchell DOB: 1980/08/22 MRN: [id number]  Date of admission: 04/09/2022 Delivery date:04/10/2022  Delivering provider: Waynard Reeds  Date of discharge: 04/11/2022  Admitting diagnosis: Advanced maternal age in multigravida, third trimester [O09.523] Spontaneous vaginal delivery [O80] Intrauterine pregnancy: [redacted]w[redacted]d     Secondary diagnosis:  Principal Problem:   Advanced maternal age in multigravida, third trimester Active Problems:   Spontaneous vaginal delivery    Discharge diagnosis: Term Pregnancy Delivered                                               Hospital course: Induction of Labor With Vaginal Delivery   41 y.o. yo [id number] at [redacted]w[redacted]d was admitted to the hospital 04/09/2022 for induction of labor.  Indication for induction: Elective.  Patient had an uncomplicated labor course as follows: Membrane Rupture Time/Date: 9:36 PM ,04/09/2022   Delivery Method:Vaginal, Spontaneous  Episiotomy: None  Lacerations:  1st degree  Details of delivery can be found in separate delivery note.  Patient had a routine postpartum course. Patient is discharged home 04/11/22.  Newborn Data: Birth date:04/10/2022  Birth time:9:41 AM  Gender:Female  Living status:Living  Apgars:9 ,9  Weight:4082 g    Physical exam  Vitals:   04/10/22 1235 04/10/22 1635 04/10/22 2102 04/11/22 0558  BP: 103/60 113/64 118/68 116/66  Pulse: (!) 104 91    Resp: 16 18 18 18   Temp: 97.8 F (36.6 C) 98.2 F (36.8 C)    TempSrc: Oral Oral    SpO2: 95%  99% 99%  Weight:      Height:       General: alert Lochia: appropriate Uterine Fundus: firm  Labs: Lab Results  Component Value Date   WBC 14.5 (H) 04/11/2022   HGB 10.8 (L) 04/11/2022   HCT 31.5 (L) 04/11/2022   MCV 90.5 04/11/2022   PLT 177 04/11/2022       No data to display         Edinburgh Score:    04/10/2022   11:10 AM  Edinburgh Postnatal Depression Scale Screening Tool  I  have been able to laugh and see the funny side of things. 0  I have looked forward with enjoyment to things. 0  I have blamed myself unnecessarily when things went wrong. 1  I have been anxious or worried for no good reason. 0  I have felt scared or panicky for no good reason. 0  Things have been getting on top of me. 0  I have been so unhappy that I have had difficulty sleeping. 0  I have felt sad or miserable. 0  I have been so unhappy that I have been crying. 0  The thought of harming myself has occurred to me. 0  Edinburgh Postnatal Depression Scale Total 1      After visit meds:  Allergies as of 04/11/2022       Reactions   Gluten Meal Other (See Comments)   GI upset    Parabens Rash, Other (See Comments)   Bleeding skin   Sodium Lauryl Sulfate Rash, Other (See Comments)   Bleeding skin        Medication List     TAKE these medications    ibuprofen 600 MG tablet Commonly known as: ADVIL Take 1 tablet (600 mg total) by mouth  every 6 (six) hours.   loratadine 10 MG tablet Commonly known as: CLARITIN Take 1 tablet (10 mg total) by mouth daily. What changed:  when to take this reasons to take this   TUMS CHEWY BITES PO Take 1 tablet by mouth at bedtime as needed (heartburn).         Discharge home in stable condition Infant Feeding: Breast Infant Disposition:home with mother Discharge instruction: per After Visit Summary and Postpartum booklet. Activity: Advance as tolerated. Pelvic rest for 6 weeks.  Diet: routine diet Postpartum Appointment:6 weeks Follow up Visit:  Follow-up Information     Huel Cote, MD. Schedule an appointment as soon as possible for a visit in 6 week(s).   Specialty: Obstetrics and Gynecology Contact information: [address] [place] [address] [phone]                     04/11/2022 Zenaida Niece, MD

## 2022-04-18 ENCOUNTER — Telehealth (HOSPITAL_COMMUNITY): Payer: Self-pay | Admitting: *Deleted

## 2022-04-18 NOTE — Telephone Encounter (Signed)
Mom reports feeling good. No concerns about herself at this time. Mom reports no changes emotionally. [facility] EPDS score=1) Mom reports baby is doing well. Feeding, peeing, and pooping without difficulty. Safe sleep reviewed. Mom reports no concerns about baby at present.  Duffy Rhody, RN 04-18-2022 at 10:20am
